# Patient Record
Sex: Male | Born: 1965 | Race: White | Hispanic: No | Marital: Married | Smoking: Never smoker
Health system: Southern US, Community
[De-identification: ages and names within clinical notes are randomized; demographics above are authoritative.]

---

## 2011-03-13 ENCOUNTER — Ambulatory Visit (HOSPITAL_COMMUNITY)
Admission: RE | Admit: 2011-03-13 | Discharge: 2011-03-13 | Disposition: A | Discharge status: Home / Self Care | Payer: 59 | Source: Ambulatory Visit | Attending: Internal Medicine | Admitting: Internal Medicine

## 2011-03-13 ENCOUNTER — Encounter (HOSPITAL_BASED_OUTPATIENT_CLINIC_OR_DEPARTMENT_OTHER): Payer: 59 | Admitting: Internal Medicine

## 2011-03-13 DIAGNOSIS — R1013 Epigastric pain: Secondary | ICD-10-CM

## 2011-03-13 DIAGNOSIS — K2960 Other gastritis without bleeding: Secondary | ICD-10-CM

## 2011-03-13 DIAGNOSIS — K259 Gastric ulcer, unspecified as acute or chronic, without hemorrhage or perforation: Secondary | ICD-10-CM | POA: Insufficient documentation

## 2011-03-31 NOTE — Op Note (Signed)
  NAMEJAYCE, Dominic Ayala              ACCOUNT NO.:  [id number]  MEDICAL RECORD NO.:  [id number]           PATIENT TYPE:  O  LOCATION:  DAYP                          FACILITY:  [facility]  PHYSICIAN:  Lionel December, M.D.    DATE OF BIRTH:  January 29, 1966  DATE OF PROCEDURE:  03/13/2011 DATE OF DISCHARGE:                              OPERATIVE REPORT   PROCEDURE:  Esophagogastroduodenoscopy.  INDICATION:  [name] is a 45 year old Caucasian male who has been experiencing intermittent epigastric pain for the last 4-5 months.  He stopped his aspirin and Aleve, is not 100% better.  He has also been treated for PPI and his H. pylori serology and further routine lab studies have been negative.  He is undergoing diagnostic EGD.  Procedure was reviewed with the patient.  Informed consent was obtained.  SURGEON:  Lionel December, MD  MEDICATIONS FOR CONSCIOUS SEDATION:  Cetacaine spray for pharyngeal topical anesthesia, Demerol 50 mg IV, and Versed 6 mg IV.  FINDINGS:  Procedure performed in Endoscopy Suite.  The patient's vital signs and O2 sat were monitored during the procedure and remained stable.  The patient was placed in left lateral position and Pentax videoscope was passed through oropharynx without any difficulty into esophagus.  Esophagus:  Mucosa of the esophagus normal.  GE junction was unremarkable, located at 40 cm from the incisors.  Stomach:  It was empty and distended very well with insufflation.  Folds of proximal stomach were normal.  Mucosa of the body and antrum was normal.  However, in the prepyloric region, there was patchy erythema. In the pyloric channel, there was a 3-mm superficial ulcer with surrounding erythema.  It looked like healing ulcer.  Pyloric channel was spastic, but not stenotic.  Scope was passed across multiple times. Angularis, fundus, and cardia were normal.  Duodenum:  Bulbar mucosa was normal.  Scope was passed through the second part of duodenum where  mucosa and folds were normal.  Ampulla of Vater was also well seen and appeared to be normal.  Endoscope was withdrawn.  The patient tolerated the procedure well.  FINAL DIAGNOSIS:  A 3- mm pyloric channel ulcer which appears to be healing along with associated inflammatory changes, but no evidence of pyloric stenosis.  Please note, the patient's H. pylori serology was negative.  RECOMMENDATIONS:  We will increase his pantoprazole to 40 mg twice daily, prescription given for 60 with 2 refills.  We will do stool antigen for H. pylori.  Further recommendations to follow.     Lionel December, M.D.     NR/MEDQ  D:  03/13/2011  T:  03/13/2011  Job:  [id number]  cc:   Lorin Picket A. Gerda Diss, MD Fax: [phone]  Electronically Signed by Lionel December M.D. on 03/31/2011 12:40:48 PM

## 2011-04-27 ENCOUNTER — Ambulatory Visit (INDEPENDENT_AMBULATORY_CARE_PROVIDER_SITE_OTHER): Payer: 59 | Admitting: Internal Medicine

## 2011-04-27 DIAGNOSIS — K257 Chronic gastric ulcer without hemorrhage or perforation: Secondary | ICD-10-CM

## 2013-11-20 ENCOUNTER — Encounter: Payer: Self-pay | Admitting: Family Medicine

## 2013-11-20 ENCOUNTER — Ambulatory Visit (INDEPENDENT_AMBULATORY_CARE_PROVIDER_SITE_OTHER): Payer: Managed Care, Other (non HMO) | Admitting: Family Medicine

## 2013-11-20 VITALS — BP 116/82 | Temp 98.7°F | Ht 68.0 in | Wt 190.2 lb

## 2013-11-20 DIAGNOSIS — R109 Unspecified abdominal pain: Secondary | ICD-10-CM

## 2013-11-20 DIAGNOSIS — Z125 Encounter for screening for malignant neoplasm of prostate: Secondary | ICD-10-CM

## 2013-11-20 DIAGNOSIS — Z0189 Encounter for other specified special examinations: Secondary | ICD-10-CM

## 2013-11-20 LAB — POCT URINALYSIS DIPSTICK: pH, UA: 5

## 2013-11-20 MED ORDER — MELOXICAM 15 MG PO TABS: 15.0000 mg | ORAL_TABLET | Freq: Every day | ORAL | Status: DC

## 2013-11-20 NOTE — Progress Notes (Addendum)
  Subjective:    Patient ID: Dominic Ayala, male    DOB: [birth year], 47 y.o.   MRN: [id number]  Abdominal Pain This is a new problem. The current episode started 1 to 4 weeks ago. The onset quality is sudden. The problem occurs intermittently. The problem has been unchanged. The pain is located in the RLQ. The pain is at a severity of 5/10. The pain is moderate. The quality of the pain is sharp. The abdominal pain does not radiate. Pertinent negatives include no diarrhea, hematuria, nausea or vomiting. Nothing aggravates the pain. The pain is relieved by nothing. He has tried nothing for the symptoms. The treatment provided no relief.   patient does state he drinks 6-12 beers on the weekend but none during the week he denies abuse denies been alcoholic. He denies any rectal bleeding hematuria.   Review of Systems  Constitutional: Negative for appetite change.  Gastrointestinal: Positive for abdominal pain. Negative for nausea, vomiting, diarrhea, blood in stool and rectal pain.       Right testicular pain  Genitourinary: Negative for hematuria and difficulty urinating.       Objective:   Physical Exam  Lungs are clear hearts regular pulse normal blood pressure good abdomen soft no guarding rebound or tenderness testicular exam normal no masses are felt no hernia. Prostate exam normal. Patient has been doing P 90 X work outs. He states that this problem started more so after he started these exercises    Assessment & Plan:  #1 right lower pelvic/groin discomfort-no abnormality felt on exam. We will check lab work. If negative then patient will try Mobic one daily for the next few weeks. If seemingly goes away with Mobic then will not need further testing if the discomfort does not go away then the ultrasound along with urology consultation.

## 2013-11-21 LAB — LIPID PANEL
Total CHOL/HDL Ratio: 2.4 Ratio
VLDL: 10 mg/dL (ref 0–40)

## 2013-11-21 LAB — CBC WITH DIFFERENTIAL/PLATELET
HCT: 44.5 % (ref 39.0–52.0)
Hemoglobin: 15.6 g/dL (ref 13.0–17.0)
Lymphocytes Relative: 18 % (ref 12–46)
Lymphs Abs: 0.9 10*3/uL (ref 0.7–4.0)
Monocytes Absolute: 0.5 10*3/uL (ref 0.1–1.0)
Monocytes Relative: 9 % (ref 3–12)
Neutro Abs: 3.5 10*3/uL (ref 1.7–7.7)
RBC: 4.97 MIL/uL (ref 4.22–5.81)
WBC: 4.9 10*3/uL (ref 4.0–10.5)

## 2013-11-21 LAB — HEPATIC FUNCTION PANEL
Albumin: 4.2 g/dL (ref 3.5–5.2)
Alkaline Phosphatase: 50 U/L (ref 39–117)
Total Bilirubin: 0.5 mg/dL (ref 0.3–1.2)

## 2013-11-21 LAB — PSA: PSA: 0.76 ng/mL (ref <=4.00)

## 2014-04-20 ENCOUNTER — Telehealth: Payer: Self-pay | Admitting: Family Medicine

## 2014-04-20 MED ORDER — SCOPOLAMINE 1 MG/3DAYS TD PT72: MEDICATED_PATCH | TRANSDERMAL | Status: DC

## 2014-04-20 NOTE — Telephone Encounter (Signed)
The patient should also note that if it causes significant drowsiness he may want to consider stopping it. In a small number of people can make vision a little fuzzy when he is trying to read the paper this is normal. Also in a small number of people it can make urination difficult if it makes urination too difficult then the best thing is to stop the medicine.

## 2014-04-20 NOTE — Telephone Encounter (Signed)
Pt going on a cruise, requesting Rx to be sent in for motion sickness patches, [facility] in [place], please call pt when done

## 2014-04-20 NOTE — Telephone Encounter (Signed)
Medication sent to pharmacy. Left message on voicemail notifying patient.  

## 2014-04-20 NOTE — Telephone Encounter (Signed)
Scopolamine patches. One box. 2 refills. Apply patch one day before leaving on a cruise. Change patch every 3 days.

## 2014-04-20 NOTE — Telephone Encounter (Signed)
Notified pharmacy to notify patient of cautions listed below.

## 2014-05-31 ENCOUNTER — Encounter: Payer: Self-pay | Admitting: Family Medicine

## 2014-05-31 ENCOUNTER — Ambulatory Visit (INDEPENDENT_AMBULATORY_CARE_PROVIDER_SITE_OTHER): Payer: Managed Care, Other (non HMO) | Admitting: Family Medicine

## 2014-05-31 VITALS — BP 128/92 | Ht 68.0 in | Wt 187.0 lb

## 2014-05-31 DIAGNOSIS — R51 Headache: Secondary | ICD-10-CM

## 2014-05-31 DIAGNOSIS — R519 Headache, unspecified: Secondary | ICD-10-CM

## 2014-05-31 DIAGNOSIS — Z569 Unspecified problems related to employment: Secondary | ICD-10-CM

## 2014-05-31 DIAGNOSIS — Z23 Encounter for immunization: Secondary | ICD-10-CM

## 2014-05-31 DIAGNOSIS — Z566 Other physical and mental strain related to work: Secondary | ICD-10-CM

## 2014-05-31 NOTE — Progress Notes (Signed)
   Subjective:    Patient ID: Dominic Ayala, male    DOB: 1966/07/15, 48 y.o.   MRN: [id number]  Headache  This is a new problem. Episode onset: A month ago. The problem occurs intermittently. The problem has been waxing and waning. The pain is located in the occipital region. The pain does not radiate. The pain quality is similar to prior headaches. The quality of the pain is described as dull. Pertinent negatives include no abdominal pain, coughing or weakness. The symptoms are aggravated by work. He has tried NSAIDs for the symptoms. The treatment provided mild relief.    Pt also has a spot on his right arm that he would like checked out. Its been there for a couple of months.     Review of Systems  Constitutional: Negative for activity change, appetite change and fatigue.  HENT: Negative for congestion.   Respiratory: Negative for cough.   Cardiovascular: Negative for chest pain.  Gastrointestinal: Negative for abdominal pain.  Endocrine: Negative for polydipsia and polyphagia.  Neurological: Positive for headaches. Negative for weakness.  Psychiatric/Behavioral: Negative for confusion.       Objective:   Physical Exam  Vitals reviewed. Constitutional: He appears well-nourished. No distress.  Cardiovascular: Normal rate, regular rhythm and normal heart sounds.   No murmur heard. Pulmonary/Chest: Effort normal and breath sounds normal. No respiratory distress.  Musculoskeletal: He exhibits no edema.  Lymphadenopathy:    He has no cervical adenopathy.  Neurological: He is alert.  Psychiatric: His behavior is normal.          Assessment & Plan:  Headaches-stress related blood pressure recheck is good healthy eating recommended he is searching for a new job which I think will help him. He denies alcohol use during the week he states anywhere from 2-6 beers on the weekend. I told him try to limit it to 6 or less on the weekend. Warning flags for severe headaches was discussed  if he has any of these such as double vision severe headache vomiting he needs to followup immediately patient states he understands this.  The area on his right arm is more of a skin age spot no sign of cancer at this point

## 2016-02-19 ENCOUNTER — Ambulatory Visit (INDEPENDENT_AMBULATORY_CARE_PROVIDER_SITE_OTHER): Payer: BLUE CROSS/BLUE SHIELD | Admitting: Family Medicine

## 2016-02-19 ENCOUNTER — Encounter: Payer: Self-pay | Admitting: Family Medicine

## 2016-02-19 VITALS — BP 120/78 | Ht 68.0 in | Wt 199.6 lb

## 2016-02-19 DIAGNOSIS — Z125 Encounter for screening for malignant neoplasm of prostate: Secondary | ICD-10-CM | POA: Diagnosis not present

## 2016-02-19 DIAGNOSIS — L989 Disorder of the skin and subcutaneous tissue, unspecified: Secondary | ICD-10-CM | POA: Diagnosis not present

## 2016-02-19 DIAGNOSIS — Z0000 Encounter for general adult medical examination without abnormal findings: Secondary | ICD-10-CM

## 2016-02-19 DIAGNOSIS — R1032 Left lower quadrant pain: Secondary | ICD-10-CM

## 2016-02-19 NOTE — Progress Notes (Signed)
   Subjective:    Patient ID: Dominic Ayala, male    DOB: 1966-04-18, 50 y.o.   MRN: [id number]  HPI  The patient comes in today for a wellness visit.    A review of their health history was completed.  A review of medications was also completed.  Any needed refills; no  Eating habits: good  Falls/  MVA accidents in past few months: none  Regular exercise: no  Specialist pt sees on regular basis: no  Preventative health issues were discussed.   no family history of colon cancer Additional concerns: hernia? Pain in left lower groin area  he relates intermittent discomfort in the left lower quadrant and some constipation as well denies any rectal bleeding fever chills or sweats  Review of Systems  Constitutional: Negative for fever, activity change and appetite change.  HENT: Negative for congestion and rhinorrhea.   Eyes: Negative for discharge.  Respiratory: Negative for cough and wheezing.   Cardiovascular: Negative for chest pain.  Gastrointestinal: Positive for abdominal pain ( left lower quadrant) and constipation ( Over the past month). Negative for vomiting and blood in stool.  Genitourinary: Negative for frequency and difficulty urinating.  Musculoskeletal: Negative for neck pain.  Skin: Negative for rash.  Allergic/Immunologic: Negative for environmental allergies and food allergies.  Neurological: Negative for weakness and headaches.  Psychiatric/Behavioral: Negative for agitation.       Objective:   Physical Exam  Constitutional: He appears well-developed and well-nourished.  HENT:  Head: Normocephalic and atraumatic.  Right Ear: External ear normal.  Left Ear: External ear normal.  Nose: Nose normal.  Mouth/Throat: Oropharynx is clear and moist.  Eyes: EOM are normal. Pupils are equal, round, and reactive to light.  Neck: Normal range of motion. Neck supple. No thyromegaly present.  Cardiovascular: Normal rate, regular rhythm and normal heart sounds.     No murmur heard. Pulmonary/Chest: Effort normal and breath sounds normal. No respiratory distress. He has no wheezes.  Abdominal: Soft. Bowel sounds are normal. He exhibits no distension and no mass. There is no tenderness.  Genitourinary: Rectum normal, prostate normal and penis normal.  Musculoskeletal: Normal range of motion. He exhibits no edema.  Lymphadenopathy:    He has no cervical adenopathy.  Neurological: He is alert. He exhibits normal muscle tone.  Skin: Skin is warm and dry. No erythema.  Psychiatric: He has a normal mood and affect. His behavior is normal. Judgment normal.    patient with abnormal mole on the right shoulder  /deltoidalso with  some tenderness subjective in the left lower quadrant but no masses were felt this does need further looking into      Assessment & Plan:   Patient was encouraged to try to minimize alcohol use Lab work recommended for screening purposes Lab work to look into the left lower quadrant discomfort Recommend colonoscopy #1 he is at the age where he needs this but #2 to help look into the left lower quadrant discomfort  if colonoscopy does not figure out why he is having left lower quadrant discomfort he may need CAT scan  patient also has abnormal mole on the right shoulder needs dermatology referral   Patient wants to make sure all referrals are within his network

## 2016-02-20 LAB — CBC WITH DIFFERENTIAL/PLATELET
Basophils Absolute: 0 10*3/uL (ref 0.0–0.2)
Basos: 1 %
EOS (ABSOLUTE): 0.1 10*3/uL (ref 0.0–0.4)
EOS: 2 %
HEMATOCRIT: 46.6 % (ref 37.5–51.0)
HEMOGLOBIN: 16.3 g/dL (ref 12.6–17.7)
IMMATURE GRANS (ABS): 0 10*3/uL (ref 0.0–0.1)
Immature Granulocytes: 0 %
LYMPHS ABS: 1.2 10*3/uL (ref 0.7–3.1)
LYMPHS: 23 %
MCH: 32 pg (ref 26.6–33.0)
MCHC: 35 g/dL (ref 31.5–35.7)
MCV: 91 fL (ref 79–97)
MONOCYTES: 10 %
Monocytes Absolute: 0.5 10*3/uL (ref 0.1–0.9)
Neutrophils Absolute: 3.3 10*3/uL (ref 1.4–7.0)
Neutrophils: 64 %
Platelets: 286 10*3/uL (ref 150–379)
RBC: 5.1 x10E6/uL (ref 4.14–5.80)
RDW: 12.8 % (ref 12.3–15.4)
WBC: 5.1 10*3/uL (ref 3.4–10.8)

## 2016-02-20 LAB — LIPID PANEL
CHOL/HDL RATIO: 3.1 ratio (ref 0.0–5.0)
Cholesterol, Total: 150 mg/dL (ref 100–199)
HDL: 48 mg/dL (ref >39)
LDL CALC: 88 mg/dL (ref 0–99)
TRIGLYCERIDES: 68 mg/dL (ref 0–149)
VLDL CHOLESTEROL CAL: 14 mg/dL (ref 5–40)

## 2016-02-20 LAB — BASIC METABOLIC PANEL
BUN/Creatinine Ratio: 15 (ref 9–20)
BUN: 13 mg/dL (ref 6–24)
CALCIUM: 9.8 mg/dL (ref 8.7–10.2)
CHLORIDE: 104 mmol/L (ref 96–106)
CO2: 28 mmol/L (ref 18–29)
Creatinine, Ser: 0.84 mg/dL (ref 0.76–1.27)
GFR calc Af Amer: 118 mL/min/{1.73_m2} (ref >59)
GFR calc non Af Amer: 102 mL/min/{1.73_m2} (ref >59)
GLUCOSE: 113 mg/dL — AB (ref 65–99)
POTASSIUM: 4.6 mmol/L (ref 3.5–5.2)
Sodium: 146 mmol/L — ABNORMAL HIGH (ref 134–144)

## 2016-02-20 LAB — HEPATIC FUNCTION PANEL
ALT: 53 IU/L — AB (ref 0–44)
AST: 34 IU/L (ref 0–40)
Albumin: 4.6 g/dL (ref 3.5–5.5)
Alkaline Phosphatase: 69 IU/L (ref 39–117)
BILIRUBIN TOTAL: 0.5 mg/dL (ref 0.0–1.2)
Bilirubin, Direct: 0.14 mg/dL (ref 0.00–0.40)
Total Protein: 7.1 g/dL (ref 6.0–8.5)

## 2016-02-20 LAB — PSA: PROSTATE SPECIFIC AG, SERUM: 0.6 ng/mL (ref 0.0–4.0)

## 2016-02-26 ENCOUNTER — Encounter: Payer: Self-pay | Admitting: Family Medicine

## 2016-02-28 ENCOUNTER — Other Ambulatory Visit: Payer: Self-pay | Admitting: *Deleted

## 2016-02-28 DIAGNOSIS — R748 Abnormal levels of other serum enzymes: Secondary | ICD-10-CM

## 2016-03-02 ENCOUNTER — Encounter: Payer: Self-pay | Admitting: Family Medicine

## 2016-03-29 ENCOUNTER — Telehealth: Payer: Self-pay | Admitting: Family Medicine

## 2016-03-29 NOTE — Telephone Encounter (Signed)
Please tell the patient that I did receive lab results from his liver profile collected on March 30. His liver enzymes are normal. This is good news. Patient was referred to gastroenterology Dr. Camille Bal in [facility]. I have not heard anything regarding his visit with them. Did the patient see the specialist? Did they do a colonoscopy? What were the results? Also if he is already seen specialists had all the testing done please send request to Dr. Wylie Hail office in [facility] requesting copies of the visit and colonoscopy thank you

## 2016-03-30 NOTE — Telephone Encounter (Signed)
Spoke with patient and informed him per Dr.Scott Luking-Dr.Scott  did receive lab results from his liver profile collected on March 30. His liver enzymes are normal. This is good news. Also asked patient if had seen Dr.Benson. Patient stated that his first appointment with Dr.Benson is on 04/08/16

## 2016-04-26 ENCOUNTER — Encounter: Payer: Self-pay | Admitting: Family Medicine

## 2016-04-26 NOTE — Telephone Encounter (Signed)
His colonoscopy went well. Letter sent to the patient.

## 2016-04-29 ENCOUNTER — Encounter: Payer: Self-pay | Admitting: Family Medicine

## 2016-04-29 DIAGNOSIS — D369 Benign neoplasm, unspecified site: Secondary | ICD-10-CM | POA: Insufficient documentation

## 2016-05-18 ENCOUNTER — Telehealth: Payer: Self-pay | Admitting: Family Medicine

## 2016-05-18 NOTE — Telephone Encounter (Signed)
Pt was sent to a specialist for a colonoscopy back three weeks ago and is wanting to know what the next steps for him will be. Please advise.

## 2016-05-18 NOTE — Telephone Encounter (Signed)
Nurse's-please talk with the patient. This nice gentleman was referred to the gastroenterologist in [facility] because of severe constipation as well as need for colonoscopy. His colonoscopy came back without any cancer. I assumed that the gastroenterologist also took care of his constipation. I did not receive any office visit note other than the colonoscopy. Please find out from the patient did gastroenterologist talk to him about constipation? Did the gastroenterologist recommend any medications or start any medicines? Also talk to the patient is at his main concern currently? 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 Please spent time talking with this nice gentleman explain the situation gather more information so I can help him appropriately thank you

## 2016-05-19 NOTE — Telephone Encounter (Signed)
Transferred patient to front desk to schedule appointment concerning this.

## 2016-05-28 ENCOUNTER — Ambulatory Visit (INDEPENDENT_AMBULATORY_CARE_PROVIDER_SITE_OTHER): Payer: BLUE CROSS/BLUE SHIELD | Admitting: Family Medicine

## 2016-05-28 ENCOUNTER — Encounter: Payer: Self-pay | Admitting: Family Medicine

## 2016-05-28 VITALS — BP 120/80 | Ht 68.0 in | Wt 198.4 lb

## 2016-05-28 DIAGNOSIS — R1032 Left lower quadrant pain: Secondary | ICD-10-CM | POA: Diagnosis not present

## 2016-05-28 NOTE — Progress Notes (Signed)
   Subjective:    Patient ID: Dominic Ayala, male    DOB: Mar 18, 1966, 50 y.o.   MRN: [id number]  HPI  Patient arrives for follow up on abdominal pain.Patient with frequent intermittent abdominal pain describes as severe in the mid abdomen the left mid quadrant in the left lower quadrant. States he has times where the pain is fairly intense other times where it's not quite as bad. Some nausea with no vomiting with it denies any rectal bleeding but does relate moderate constipation issues. Sometimes it does wake him up at night he denies fever chills or sweats with it. Patient states that he does not use alcohol frequently. He relates he is not taking any type of anti-inflammatories. Denies any pulled muscle or strains. Past medical family history noncontributory Review of Systems See above.    Objective:   Physical Exam  Lungs clear hearts regular pulse normal abdomen soft with mild-to-moderate midabdominal and left mid and left lower quadrant tenderness. Patient has had previous prostate exam and PSA which are normal.      Assessment & Plan:  Significant mid and lower abdominal pain and discomfort that has been going on for several months progressively worse we need to do a CAT scan rule out growth or tumor. Colonoscopy that he had did show adenomatous polyps but did not show any other particular problems gastroenterologist recommended doing a scan if ongoing abdominal pain

## 2016-05-29 ENCOUNTER — Telehealth: Payer: Self-pay | Admitting: *Deleted

## 2016-05-29 NOTE — Telephone Encounter (Signed)
Patient scheduled for CT abd/pelvis with and without contrast at [facility] 06/11/16 at 2:20pm be there at 1:50pm nothing to eat or drink 4 hrs before test

## 2016-05-29 NOTE — Telephone Encounter (Signed)
Left message to return call 

## 2016-06-01 NOTE — Telephone Encounter (Signed)
Left message to return call 

## 2016-06-01 NOTE — Telephone Encounter (Addendum)
Patient notified that he is scheduled for CT abd/pelvis with and without contrast at [facility] 06/11/16 at 2:20pm be there at 1:50pm nothing to eat or drink 4 hrs before test. Patient verbalized understanding.

## 2016-06-10 ENCOUNTER — Telehealth: Payer: Self-pay | Admitting: *Deleted

## 2016-06-10 NOTE — Telephone Encounter (Signed)
[facility] called wanting order changed. Pt scheduled for tomorrow for ct abd pelvis with and without contrast. Radiologist states it needs to be ct abd/pelvis with contrast. Consult with dr Richardson Landry. Ok to change. New order faxed to [facility].

## 2016-06-21 ENCOUNTER — Telehealth: Payer: Self-pay | Admitting: Family Medicine

## 2016-06-21 NOTE — Telephone Encounter (Signed)
Please let the patient know that his CT scan that he had at [facility] on June 15 did not show any tumors or growths. It is considered normal. Very important for the patient to make sure he stays on a good diet to keep his bowel movements soft. Also it is possible that his abdominal discomfort he is having is musculoskeletal. If it is not getting better over the course of the next month I can help set him up with physical therapy. Follow-up in the 2 months time for recheck

## 2016-06-22 NOTE — Telephone Encounter (Signed)
Spoke with patient and informed her per Dr.Scott Luking- CT scan that he had at [facility] on June 15 did not show any tumors or growths. It is considered normal. Very important for the patient to make sure he stays on a good diet to keep his bowel movements soft. Also it is possible that his abdominal discomfort he is having is musculoskeletal. If it is not getting better over the course of the next month I can help set him up with physical therapy. Follow-up in the 2 months time for recheck. Patient verbalized understanding.

## 2016-07-29 ENCOUNTER — Encounter: Payer: Self-pay | Admitting: Family Medicine

## 2016-10-02 ENCOUNTER — Telehealth: Payer: Self-pay | Admitting: *Deleted

## 2016-10-02 NOTE — Telephone Encounter (Signed)
[facility]  - See form in nurse's basket. Pt needs to come and sign or if he choses not to sign just fax with out signature per dr Nicki Reaper. Mail copy to pt and also scan into chart. Send lab results with form also. They are also in folder.

## 2016-10-15 NOTE — Telephone Encounter (Signed)
Formed faxed and mailed to address on form. Form left up front to be mailed to pt and scanned into pt's chart.

## 2017-04-02 ENCOUNTER — Encounter: Payer: Self-pay | Admitting: Family Medicine

## 2017-04-02 NOTE — Telephone Encounter (Signed)
ERROR

## 2017-04-12 ENCOUNTER — Ambulatory Visit (INDEPENDENT_AMBULATORY_CARE_PROVIDER_SITE_OTHER): Payer: BLUE CROSS/BLUE SHIELD | Admitting: Nurse Practitioner

## 2017-04-12 ENCOUNTER — Encounter: Payer: Self-pay | Admitting: Nurse Practitioner

## 2017-04-12 VITALS — BP 124/82 | Temp 98.3°F | Ht 68.0 in | Wt 199.0 lb

## 2017-04-12 DIAGNOSIS — M5442 Lumbago with sciatica, left side: Secondary | ICD-10-CM

## 2017-04-12 MED ORDER — PREDNISONE 20 MG PO TABS: ORAL_TABLET | ORAL | 0 refills | Status: DC

## 2017-04-13 ENCOUNTER — Ambulatory Visit: Payer: BLUE CROSS/BLUE SHIELD | Admitting: Family Medicine

## 2017-04-14 ENCOUNTER — Encounter: Payer: Self-pay | Admitting: Nurse Practitioner

## 2017-04-14 NOTE — Progress Notes (Signed)
Subjective:  Presents for c/o left low back pain for about 4-5 weeks. Has been going to chiropractor off/on for years. No specific history of injury. Began after working around his property. Worse at times. Continues stretching and chiropractic care. No change in bowel or bladder habits. Pain goes from left low back area to the mid buttock down the leg to the calf area. No numbness or weakness of left foot.   Objective:   BP 124/82   Temp 98.3 F (36.8 C)   Ht 5\' 8"  (1.727 m)   Wt 199 lb (90.3 kg)   BMI 30.26 kg/m  NAD. Alert, oriented. Minimal tenderness with palpation of left low back area. SLR neg right; positive left. Reflexes normal lower extremities. Can perform ROM of the back with some tenderness with lateral movement and rotation. Can perform heel and toe walking.   Assessment:  Acute left-sided low back pain with left-sided sciatica    Plan:   Meds ordered this encounter  Medications  . predniSONE (DELTASONE) 20 MG tablet    Sig: 3 po qd x 3 d then 2 po qd x 3 d then 1 po qd x 2 d    Dispense:  17 tablet    Refill:  0    Order Specific Question:   Supervising Provider    Answer:   LUKING, WILLIAM S [[id number]]   Ice/heat applications. Continue stretching. Lidocaine patches or Biofreeze. Call back in 7-10 days if not resolved, sooner if worse.

## 2018-11-16 ENCOUNTER — Telehealth: Payer: Self-pay | Admitting: Family Medicine

## 2018-11-16 DIAGNOSIS — Z125 Encounter for screening for malignant neoplasm of prostate: Secondary | ICD-10-CM

## 2018-11-16 DIAGNOSIS — Z13220 Encounter for screening for lipoid disorders: Secondary | ICD-10-CM

## 2018-11-16 DIAGNOSIS — Z79899 Other long term (current) drug therapy: Secondary | ICD-10-CM

## 2018-11-16 NOTE — Telephone Encounter (Signed)
Labs placed in epic. I called and left a message to r/c.

## 2018-11-16 NOTE — Telephone Encounter (Signed)
Patient is aware 

## 2018-11-16 NOTE — Telephone Encounter (Signed)
Pt would like to have lab work done before his physical on 12/6.

## 2018-11-16 NOTE — Telephone Encounter (Signed)
All the lab work that was completed on 2017 as listed should be repeated again

## 2018-11-16 NOTE — Telephone Encounter (Signed)
Last had labs 02/19/2016 Psa,bmet,hepatic,lip,cbc. Please advise.

## 2018-12-01 LAB — CBC WITH DIFFERENTIAL/PLATELET
Basophils Absolute: 0 10*3/uL (ref 0.0–0.2)
Basos: 1 %
EOS (ABSOLUTE): 0.1 10*3/uL (ref 0.0–0.4)
Eos: 2 %
Hematocrit: 48.9 % (ref 37.5–51.0)
Hemoglobin: 17.1 g/dL (ref 13.0–17.7)
IMMATURE GRANS (ABS): 0 10*3/uL (ref 0.0–0.1)
Immature Granulocytes: 0 %
LYMPHS: 14 %
Lymphocytes Absolute: 0.9 10*3/uL (ref 0.7–3.1)
MCH: 31.9 pg (ref 26.6–33.0)
MCHC: 35 g/dL (ref 31.5–35.7)
MCV: 91 fL (ref 79–97)
MONOCYTES: 15 %
Monocytes Absolute: 0.9 10*3/uL (ref 0.1–0.9)
Neutrophils Absolute: 4.2 10*3/uL (ref 1.4–7.0)
Neutrophils: 68 %
PLATELETS: 230 10*3/uL (ref 150–450)
RBC: 5.36 x10E6/uL (ref 4.14–5.80)
RDW: 12.4 % (ref 12.3–15.4)
WBC: 6.1 10*3/uL (ref 3.4–10.8)

## 2018-12-01 LAB — BASIC METABOLIC PANEL
BUN/Creatinine Ratio: 14 (ref 9–20)
BUN: 13 mg/dL (ref 6–24)
CALCIUM: 9.5 mg/dL (ref 8.7–10.2)
CO2: 23 mmol/L (ref 20–29)
CREATININE: 0.91 mg/dL (ref 0.76–1.27)
Chloride: 99 mmol/L (ref 96–106)
GFR calc Af Amer: 111 mL/min/{1.73_m2} (ref >59)
GFR, EST NON AFRICAN AMERICAN: 96 mL/min/{1.73_m2} (ref >59)
Glucose: 112 mg/dL — ABNORMAL HIGH (ref 65–99)
Potassium: 4.7 mmol/L (ref 3.5–5.2)
Sodium: 141 mmol/L (ref 134–144)

## 2018-12-01 LAB — HEPATIC FUNCTION PANEL
ALK PHOS: 73 IU/L (ref 39–117)
ALT: 135 IU/L — ABNORMAL HIGH (ref 0–44)
AST: 72 IU/L — AB (ref 0–40)
Albumin: 4.5 g/dL (ref 3.5–5.5)
BILIRUBIN TOTAL: 0.7 mg/dL (ref 0.0–1.2)
BILIRUBIN, DIRECT: 0.18 mg/dL (ref 0.00–0.40)
Total Protein: 7.2 g/dL (ref 6.0–8.5)

## 2018-12-01 LAB — LIPID PANEL
CHOL/HDL RATIO: 3.3 ratio (ref 0.0–5.0)
Cholesterol, Total: 186 mg/dL (ref 100–199)
HDL: 57 mg/dL (ref >39)
LDL Calculated: 115 mg/dL — ABNORMAL HIGH (ref 0–99)
Triglycerides: 71 mg/dL (ref 0–149)
VLDL Cholesterol Cal: 14 mg/dL (ref 5–40)

## 2018-12-01 LAB — PSA: PROSTATE SPECIFIC AG, SERUM: 0.6 ng/mL (ref 0.0–4.0)

## 2018-12-02 ENCOUNTER — Encounter: Payer: Self-pay | Admitting: Family Medicine

## 2018-12-02 ENCOUNTER — Ambulatory Visit (INDEPENDENT_AMBULATORY_CARE_PROVIDER_SITE_OTHER): Payer: BLUE CROSS/BLUE SHIELD | Admitting: Family Medicine

## 2018-12-02 VITALS — BP 134/88 | Temp 99.1°F | Ht 68.5 in | Wt 202.6 lb

## 2018-12-02 DIAGNOSIS — R748 Abnormal levels of other serum enzymes: Secondary | ICD-10-CM

## 2018-12-02 DIAGNOSIS — Z0000 Encounter for general adult medical examination without abnormal findings: Secondary | ICD-10-CM | POA: Diagnosis not present

## 2018-12-02 DIAGNOSIS — R1032 Left lower quadrant pain: Secondary | ICD-10-CM | POA: Diagnosis not present

## 2018-12-02 NOTE — Progress Notes (Signed)
[place] order placed and Referral placed in Epic for an appointment with Dr.Rehman.

## 2018-12-02 NOTE — Progress Notes (Signed)
Tried calling no answer. I called and left a vm,asked that he r/c.

## 2018-12-02 NOTE — Progress Notes (Signed)
   Subjective:    Patient ID: Dominic Ayala, male    DOB: 1966/09/14, 52 y.o.   MRN: [id number]  HPI The patient comes in today for a wellness visit.   Very nice patient In a high stress job Does not always get good sleep at night because typically gets called a lot from his workplace A review of their health history was completed.  A review of medications was also completed.  Any needed refills; not taking any meds  Eating habits: health conscious  Falls/  MVA accidents in past few months: none  Regular exercise: no exercise, activities around house/yard  Specialist pt sees on regular basis: none  Preventative health issues were discussed.   Additional concerns:lower abdominal pain for past couple of months. Getting worse.   Patient does consume more alcohol than he should he states on the weekend he may drink anywhere from 6-12 beers during the week he denies drinking  Review of Systems  Constitutional: Negative for diaphoresis and fatigue.  HENT: Negative for congestion and rhinorrhea.   Respiratory: Negative for cough and shortness of breath.   Cardiovascular: Negative for chest pain and leg swelling.  Gastrointestinal: Negative for abdominal pain and diarrhea.  Skin: Negative for color change and rash.  Neurological: Negative for dizziness and headaches.  Psychiatric/Behavioral: Negative for behavioral problems and confusion.       Objective:   Physical Exam  Constitutional: He appears well-nourished. No distress.  HENT:  Head: Normocephalic and atraumatic.  Eyes: Right eye exhibits no discharge. Left eye exhibits no discharge.  Neck: No tracheal deviation present.  Cardiovascular: Normal rate, regular rhythm and normal heart sounds.  No murmur heard. Pulmonary/Chest: Effort normal and breath sounds normal. No respiratory distress.  Musculoskeletal: He exhibits no edema.  Lymphadenopathy:    He has no cervical adenopathy.  Neurological: He is alert.  Coordination normal.  Skin: Skin is warm and dry.  Psychiatric: He has a normal mood and affect. His behavior is normal.  Vitals reviewed. 20-minute discussion held with patient regarding his elevated liver enzymes in the work-up of this  Prostate exam normal      Assessment & Plan:  Adult wellness-complete.wellness physical was conducted today. Importance of diet and exercise were discussed in detail.  In addition to this a discussion regarding safety was also covered. We also reviewed over immunizations and gave recommendations regarding current immunization needed for age.  In addition to this additional areas were also touched on including: Preventative health exams needed:  Colonoscopy due 2022  Patient was advised yearly wellness exam  Patient would benefit from work-up of probable fatty liver I encouraged him to cut out alcohol or at the very least cut it way back to less than 1 or 2 beers a day We will try to add additional lab work to what he is currently did the other day He will need to have ultrasound of the liver  He also has some intermittent lower left quadrant abdominal discomfort referral to gastroenterology for this may need a early colonoscopy I do not feel there is an indication for CAT scan right at the moment but is possible this may need to be done

## 2018-12-02 NOTE — Addendum Note (Signed)
Addended by: Karle Barr on: 12/02/2018 02:52 PM   Modules accepted: Orders

## 2018-12-02 NOTE — Patient Instructions (Signed)
Results for orders placed or performed in visit on 11/16/18  PSA  Result Value Ref Range   Prostate Specific Ag, Serum 0.6 0.0 - 4.0 ng/mL  Basic metabolic panel  Result Value Ref Range   Glucose 112 (H) 65 - 99 mg/dL   BUN 13 6 - 24 mg/dL   Creatinine, Ser 0.91 0.76 - 1.27 mg/dL   GFR calc non Af Amer 96 >59 mL/min/1.73   GFR calc Af Amer 111 >59 mL/min/1.73   BUN/Creatinine Ratio 14 9 - 20   Sodium 141 134 - 144 mmol/L   Potassium 4.7 3.5 - 5.2 mmol/L   Chloride 99 96 - 106 mmol/L   CO2 23 20 - 29 mmol/L   Calcium 9.5 8.7 - 10.2 mg/dL  Hepatic function panel  Result Value Ref Range   Total Protein 7.2 6.0 - 8.5 g/dL   Albumin 4.5 3.5 - 5.5 g/dL   Bilirubin Total 0.7 0.0 - 1.2 mg/dL   Bilirubin, Direct 0.18 0.00 - 0.40 mg/dL   Alkaline Phosphatase 73 39 - 117 IU/L   AST 72 (H) 0 - 40 IU/L   ALT 135 (H) 0 - 44 IU/L  Lipid panel  Result Value Ref Range   Cholesterol, Total 186 100 - 199 mg/dL   Triglycerides 71 0 - 149 mg/dL   HDL 57 >39 mg/dL   VLDL Cholesterol Cal 14 5 - 40 mg/dL   LDL Calculated 115 (H) 0 - 99 mg/dL   Chol/HDL Ratio 3.3 0.0 - 5.0 ratio  CBC with Differential/Platelet  Result Value Ref Range   WBC 6.1 3.4 - 10.8 x10E3/uL   RBC 5.36 4.14 - 5.80 x10E6/uL   Hemoglobin 17.1 13.0 - 17.7 g/dL   Hematocrit 48.9 37.5 - 51.0 %   MCV 91 79 - 97 fL   MCH 31.9 26.6 - 33.0 pg   MCHC 35.0 31.5 - 35.7 g/dL   RDW 12.4 12.3 - 15.4 %   Platelets 230 150 - 450 x10E3/uL   Neutrophils 68 Not Estab. %   Lymphs 14 Not Estab. %   Monocytes 15 Not Estab. %   Eos 2 Not Estab. %   Basos 1 Not Estab. %   Neutrophils Absolute 4.2 1.4 - 7.0 x10E3/uL   Lymphocytes Absolute 0.9 0.7 - 3.1 x10E3/uL   Monocytes Absolute 0.9 0.1 - 0.9 x10E3/uL   EOS (ABSOLUTE) 0.1 0.0 - 0.4 x10E3/uL   Basophils Absolute 0.0 0.0 - 0.2 x10E3/uL   Immature Granulocytes 0 Not Estab. %   Immature Grans (Abs) 0.0 0.0 - 0.1 x10E3/uL

## 2018-12-02 NOTE — Progress Notes (Signed)
Test added to Labs drawn on 11/30/2018 at [place] by [facility].12/02/2018 at 2:40pm

## 2018-12-03 LAB — SPECIMEN STATUS REPORT

## 2018-12-05 ENCOUNTER — Encounter: Payer: Self-pay | Admitting: Family Medicine

## 2018-12-07 ENCOUNTER — Encounter: Payer: Self-pay | Admitting: Family Medicine

## 2018-12-08 ENCOUNTER — Ambulatory Visit (HOSPITAL_COMMUNITY): Payer: Self-pay

## 2018-12-08 LAB — IRON AND TIBC
IRON SATURATION: 30 % (ref 15–55)
Iron: 102 ug/dL (ref 38–169)
TIBC: 340 ug/dL (ref 250–450)
UIBC: 238 ug/dL (ref 111–343)

## 2018-12-08 LAB — FERRITIN: Ferritin: 523 ng/mL — ABNORMAL HIGH (ref 30–400)

## 2018-12-08 LAB — SPECIMEN STATUS REPORT

## 2018-12-08 LAB — CERULOPLASMIN: Ceruloplasmin: 23.7 mg/dL (ref 16.0–31.0)

## 2018-12-08 LAB — HEPATITIS B SURFACE ANTIGEN: Hepatitis B Surface Ag: NEGATIVE

## 2018-12-08 LAB — ANA: Anti Nuclear Antibody(ANA): NEGATIVE

## 2018-12-08 LAB — HEPATITIS C ANTIBODY: Hep C Virus Ab: <0.1 s/co ratio (ref 0.0–0.9)

## 2018-12-13 ENCOUNTER — Ambulatory Visit (HOSPITAL_COMMUNITY)
Admission: RE | Admit: 2018-12-13 | Discharge: 2018-12-13 | Disposition: A | Discharge status: Home / Self Care | Payer: BLUE CROSS/BLUE SHIELD | Source: Ambulatory Visit | Attending: Family Medicine | Admitting: Family Medicine

## 2018-12-13 DIAGNOSIS — R1032 Left lower quadrant pain: Secondary | ICD-10-CM | POA: Diagnosis not present

## 2018-12-13 DIAGNOSIS — R748 Abnormal levels of other serum enzymes: Secondary | ICD-10-CM | POA: Insufficient documentation

## 2018-12-13 DIAGNOSIS — K760 Fatty (change of) liver, not elsewhere classified: Secondary | ICD-10-CM | POA: Diagnosis not present

## 2018-12-26 ENCOUNTER — Ambulatory Visit (INDEPENDENT_AMBULATORY_CARE_PROVIDER_SITE_OTHER): Payer: BLUE CROSS/BLUE SHIELD | Admitting: Internal Medicine

## 2018-12-26 ENCOUNTER — Encounter (INDEPENDENT_AMBULATORY_CARE_PROVIDER_SITE_OTHER): Payer: Self-pay | Admitting: Internal Medicine

## 2018-12-26 VITALS — BP 130/80 | HR 64 | Temp 98.0°F | Ht 68.0 in | Wt 205.6 lb

## 2018-12-26 DIAGNOSIS — R1032 Left lower quadrant pain: Secondary | ICD-10-CM | POA: Diagnosis not present

## 2018-12-26 DIAGNOSIS — R748 Abnormal levels of other serum enzymes: Secondary | ICD-10-CM | POA: Diagnosis not present

## 2018-12-26 NOTE — Patient Instructions (Signed)
Labs and CT abdomen.

## 2018-12-26 NOTE — Progress Notes (Addendum)
   Subjective:    Patient ID: Dominic Ayala, male    DOB: [birth year], 52 y.o.   MRN: [id number]  HPI Referred by Dr. Wolfgang Phoenix for elevated liver enzymes and LLQ pain. [place] RUQ on 12/13/2018 revealed IMPRESSION: Diffuse hepatic steatosis. Normal gallbladder and biliary tree. Last colonoscopy in 2017 per patient Dr. Britta Mccreedy. ( I will locate that report). Has pain in his LLQ for several months. The pain comes and goes. But lately, it has become more constant.  Describes as a warm, tingling feeling.  His appetite is good for the most part.  He says he has irregular BMs.  He had a BM x 2 a day. Now he leans toward constipation.  No family hx of colon cancer. Denies any fever. Underwent a CT abdomen/pelvis with CM  06/11/2016 for LLQ pain which was normal.   04/20/2016 Colonoscopy (screeing).  Sessile polyp raging between 5-9 mm found at hepatic flexure and cecum. Polypectomy.  Biopsy Serrated adenoma.   Drinks 12-14 beers in a week.   11/30/2018 Hep C virus negative. ANA negative.  Iron 102, UIBC 238, TIBC 340.   Ferritin 523, Hepatitis B surface Ag negative, ceruloplasmin 23.7  Iron Hepatic Function Latest Ref Rng & Units 11/30/2018 02/19/2016 11/20/2013  Total Protein 6.0 - 8.5 g/dL 7.2 7.1 6.4  Albumin 3.5 - 5.5 g/dL 4.5 4.6 4.2  AST 0 - 40 IU/L 72(H) 34 18  ALT 0 - 44 IU/L 135(H) 53(H) 20  Alk Phosphatase 39 - 117 IU/L 73 69 50  Total Bilirubin 0.0 - 1.2 mg/dL 0.7 0.5 0.5  Bilirubin, Direct 0.00 - 0.40 mg/dL 0.18 0.14 0.1   CBC    Component Value Date/Time   WBC 6.1 11/30/2018 0844   WBC 4.9 11/20/2013 0749   RBC 5.36 11/30/2018 0844   RBC 4.97 11/20/2013 0749   HGB 17.1 11/30/2018 0844   HCT 48.9 11/30/2018 0844   PLT 230 11/30/2018 0844   MCV 91 11/30/2018 0844   MCH 31.9 11/30/2018 0844   MCH 31.4 11/20/2013 0749   MCHC 35.0 11/30/2018 0844   MCHC 35.1 11/20/2013 0749   RDW 12.4 11/30/2018 0844   LYMPHSABS 0.9 11/30/2018 0844   MONOABS 0.5 11/20/2013 0749   EOSABS 0.1  11/30/2018 0844   BASOSABS 0.0 11/30/2018 0844     Review of Systems History reviewed. No pertinent past medical history.  History reviewed. No pertinent surgical history.  No Known Allergies  No current outpatient medications on file prior to visit.   No current facility-administered medications on file prior to visit.         Objective:   Physical Exam Blood pressure 130/80, pulse 64, temperature 98 F (36.7 C), height '5\' 8"'$  (1.727 m), weight 205 lb 9.6 oz (93.3 kg). Alert and oriented. Skin warm and dry. Oral mucosa is moist.   . Sclera anicteric, conjunctivae is pink. Thyroid not enlarged. No cervical lymphadenopathy. Lungs clear. Heart regular rate and rhythm.  Abdomen is soft. Bowel sounds are positive. No hepatomegaly. No abdominal masses felt. No tenderness.  No edema to lower extremities.           Assessment & Plan:  Elevated liver enzymes. Will repeat Hepatic function LLQ pain: CT abdomen/pelvic CM.  Will get last colonoscopy report. Further recommendations to follow (done)

## 2018-12-27 LAB — HEPATIC FUNCTION PANEL
AG Ratio: 1.6 (calc) (ref 1.0–2.5)
ALKALINE PHOSPHATASE (APISO): 70 U/L (ref 40–115)
ALT: 72 U/L — AB (ref 9–46)
AST: 34 U/L (ref 10–35)
Albumin: 4.4 g/dL (ref 3.6–5.1)
BILIRUBIN INDIRECT: 0.5 mg/dL (ref 0.2–1.2)
Bilirubin, Direct: 0.1 mg/dL (ref 0.0–0.2)
Globulin: 2.7 g/dL (calc) (ref 1.9–3.7)
TOTAL PROTEIN: 7.1 g/dL (ref 6.1–8.1)
Total Bilirubin: 0.6 mg/dL (ref 0.2–1.2)

## 2018-12-27 LAB — HEPATITIS PANEL, ACUTE
HEP B C IGM: NONREACTIVE
HEP B S AG: NONREACTIVE
HEP C AB: NONREACTIVE
Hep A IgM: NONREACTIVE
SIGNAL TO CUT-OFF: 0.04 (ref <1.00)

## 2019-01-10 ENCOUNTER — Ambulatory Visit (HOSPITAL_COMMUNITY)
Admission: RE | Admit: 2019-01-10 | Discharge: 2019-01-10 | Disposition: A | Discharge status: Home / Self Care | Payer: BLUE CROSS/BLUE SHIELD | Source: Ambulatory Visit | Attending: Internal Medicine | Admitting: Internal Medicine

## 2019-01-10 DIAGNOSIS — R1032 Left lower quadrant pain: Secondary | ICD-10-CM | POA: Diagnosis present

## 2019-01-10 MED ORDER — IOHEXOL 300 MG/ML  SOLN: 100.0000 mL | Freq: Once | INTRAMUSCULAR | Status: DC | PRN

## 2019-01-10 MED ORDER — IOPAMIDOL (ISOVUE-300) INJECTION 61%
100.0000 mL | Freq: Once | INTRAVENOUS | Status: AC | PRN
  Administered 2019-01-10: 100 mL via INTRAVENOUS

## 2019-01-11 ENCOUNTER — Telehealth (INDEPENDENT_AMBULATORY_CARE_PROVIDER_SITE_OTHER): Payer: Self-pay | Admitting: Internal Medicine

## 2019-01-11 ENCOUNTER — Other Ambulatory Visit (INDEPENDENT_AMBULATORY_CARE_PROVIDER_SITE_OTHER): Payer: Self-pay | Admitting: *Deleted

## 2019-01-11 DIAGNOSIS — K5732 Diverticulitis of large intestine without perforation or abscess without bleeding: Secondary | ICD-10-CM

## 2019-01-11 DIAGNOSIS — R748 Abnormal levels of other serum enzymes: Secondary | ICD-10-CM

## 2019-01-11 MED ORDER — METRONIDAZOLE 500 MG PO TABS: 500.0000 mg | ORAL_TABLET | Freq: Two times a day (BID) | ORAL | 0 refills | Status: DC

## 2019-01-11 MED ORDER — CIPROFLOXACIN HCL 500 MG PO TABS: 500.0000 mg | ORAL_TABLET | Freq: Two times a day (BID) | ORAL | 0 refills | Status: DC

## 2019-01-11 NOTE — Telephone Encounter (Signed)
I have sent an Rx over to his pharmacy. Cipro and Flagyl x 14 days.

## 2019-01-11 NOTE — Telephone Encounter (Signed)
Hepatic in 4 weeks. Please send letter.

## 2019-01-11 NOTE — Telephone Encounter (Signed)
Hepatic Profile Noted for 4 weeks and a letter will be sent to the patient as a reminder.

## 2019-01-23 ENCOUNTER — Encounter (INDEPENDENT_AMBULATORY_CARE_PROVIDER_SITE_OTHER): Payer: Self-pay | Admitting: *Deleted

## 2019-01-23 ENCOUNTER — Other Ambulatory Visit (INDEPENDENT_AMBULATORY_CARE_PROVIDER_SITE_OTHER): Payer: Self-pay | Admitting: *Deleted

## 2019-01-23 DIAGNOSIS — R748 Abnormal levels of other serum enzymes: Secondary | ICD-10-CM

## 2019-02-02 LAB — HEPATIC FUNCTION PANEL
AG Ratio: 1.6 (calc) (ref 1.0–2.5)
ALT: 68 U/L — ABNORMAL HIGH (ref 9–46)
AST: 34 U/L (ref 10–35)
Albumin: 4.1 g/dL (ref 3.6–5.1)
Alkaline phosphatase (APISO): 60 U/L (ref 35–144)
BILIRUBIN DIRECT: 0.1 mg/dL (ref 0.0–0.2)
Globulin: 2.5 g/dL (calc) (ref 1.9–3.7)
Indirect Bilirubin: 0.5 mg/dL (calc) (ref 0.2–1.2)
Total Bilirubin: 0.6 mg/dL (ref 0.2–1.2)
Total Protein: 6.6 g/dL (ref 6.1–8.1)

## 2019-02-03 ENCOUNTER — Telehealth (INDEPENDENT_AMBULATORY_CARE_PROVIDER_SITE_OTHER): Payer: Self-pay | Admitting: Internal Medicine

## 2019-02-03 ENCOUNTER — Other Ambulatory Visit (INDEPENDENT_AMBULATORY_CARE_PROVIDER_SITE_OTHER): Payer: Self-pay | Admitting: *Deleted

## 2019-02-03 ENCOUNTER — Other Ambulatory Visit (INDEPENDENT_AMBULATORY_CARE_PROVIDER_SITE_OTHER): Payer: Self-pay | Admitting: Internal Medicine

## 2019-02-03 DIAGNOSIS — R748 Abnormal levels of other serum enzymes: Secondary | ICD-10-CM

## 2019-02-03 DIAGNOSIS — R933 Abnormal findings on diagnostic imaging of other parts of digestive tract: Secondary | ICD-10-CM

## 2019-02-03 NOTE — Telephone Encounter (Signed)
Dominic Ayala, Colonoscopy. 6 weeks out. Hx of diverticulitis.

## 2019-02-06 ENCOUNTER — Encounter (INDEPENDENT_AMBULATORY_CARE_PROVIDER_SITE_OTHER): Payer: Self-pay | Admitting: *Deleted

## 2019-02-06 ENCOUNTER — Telehealth (INDEPENDENT_AMBULATORY_CARE_PROVIDER_SITE_OTHER): Payer: Self-pay | Admitting: *Deleted

## 2019-02-06 DIAGNOSIS — R933 Abnormal findings on diagnostic imaging of other parts of digestive tract: Secondary | ICD-10-CM | POA: Insufficient documentation

## 2019-02-06 MED ORDER — SUPREP BOWEL PREP KIT 17.5-3.13-1.6 GM/177ML PO SOLN: 1.0000 | Freq: Once | ORAL | 0 refills | Status: AC

## 2019-02-06 NOTE — Telephone Encounter (Signed)
Patient needs suprep 

## 2019-02-06 NOTE — Telephone Encounter (Signed)
TCS sch'd 03/16/19 at 1200 (1100), left detailed message for patient, instructions mialed

## 2019-03-16 ENCOUNTER — Other Ambulatory Visit: Payer: Self-pay

## 2019-03-16 ENCOUNTER — Other Ambulatory Visit (INDEPENDENT_AMBULATORY_CARE_PROVIDER_SITE_OTHER): Payer: Self-pay | Admitting: *Deleted

## 2019-03-16 ENCOUNTER — Ambulatory Visit (HOSPITAL_COMMUNITY)
Admission: RE | Admit: 2019-03-16 | Discharge: 2019-03-16 | Disposition: A | Discharge status: Home / Self Care | Payer: BLUE CROSS/BLUE SHIELD | Attending: Internal Medicine | Admitting: Internal Medicine

## 2019-03-16 ENCOUNTER — Encounter (HOSPITAL_COMMUNITY)
Admission: RE | Disposition: A | Discharge status: Home / Self Care | Payer: Self-pay | Source: Home / Self Care | Attending: Internal Medicine

## 2019-03-16 ENCOUNTER — Encounter (INDEPENDENT_AMBULATORY_CARE_PROVIDER_SITE_OTHER): Payer: Self-pay | Admitting: *Deleted

## 2019-03-16 ENCOUNTER — Encounter (HOSPITAL_COMMUNITY): Payer: Self-pay | Admitting: *Deleted

## 2019-03-16 DIAGNOSIS — K648 Other hemorrhoids: Secondary | ICD-10-CM

## 2019-03-16 DIAGNOSIS — K760 Fatty (change of) liver, not elsewhere classified: Secondary | ICD-10-CM | POA: Insufficient documentation

## 2019-03-16 DIAGNOSIS — R933 Abnormal findings on diagnostic imaging of other parts of digestive tract: Secondary | ICD-10-CM | POA: Insufficient documentation

## 2019-03-16 DIAGNOSIS — Z86010 Personal history of colonic polyps: Secondary | ICD-10-CM | POA: Insufficient documentation

## 2019-03-16 DIAGNOSIS — R748 Abnormal levels of other serum enzymes: Secondary | ICD-10-CM

## 2019-03-16 HISTORY — PX: COLONOSCOPY: SHX5424

## 2019-03-16 SURGERY — COLONOSCOPY
Anesthesia: Moderate Sedation

## 2019-03-16 MED ORDER — SODIUM CHLORIDE 0.9 % IV SOLN
INTRAVENOUS | Status: DC
  Administered 2019-03-16: 12:00:00 via INTRAVENOUS

## 2019-03-16 MED ORDER — STERILE WATER FOR IRRIGATION IR SOLN
Status: DC | PRN
  Administered 2019-03-16: 12:00:00

## 2019-03-16 MED ORDER — MEPERIDINE HCL 50 MG/ML IJ SOLN
INTRAMUSCULAR | Status: AC
  Filled 2019-03-16: qty 1

## 2019-03-16 MED ORDER — MIDAZOLAM HCL 5 MG/5ML IJ SOLN
INTRAMUSCULAR | Status: AC
  Filled 2019-03-16: qty 10

## 2019-03-16 MED ORDER — MEPERIDINE HCL 50 MG/ML IJ SOLN
INTRAMUSCULAR | Status: DC | PRN
  Administered 2019-03-16 (×2): 25 mg via INTRAVENOUS

## 2019-03-16 MED ORDER — MIDAZOLAM HCL 5 MG/5ML IJ SOLN
INTRAMUSCULAR | Status: DC | PRN
  Administered 2019-03-16 (×4): 2 mg via INTRAVENOUS

## 2019-03-16 NOTE — Discharge Instructions (Signed)
Resume usual medications and diet as before. No driving for 24 hours. Next colonoscopy in 5 hours.  PATIENT INSTRUCTIONS POST-ANESTHESIA  IMMEDIATELY FOLLOWING SURGERY:  Do not drive or operate machinery for the first twenty four hours after surgery.  Do not make any important decisions for twenty four hours after surgery or while taking narcotic pain medications or sedatives.  If you develop intractable nausea and vomiting or a severe headache please notify your doctor immediately.  FOLLOW-UP:  Please make an appointment with your surgeon as instructed. You do not need to follow up with anesthesia unless specifically instructed to do so.  WOUND CARE INSTRUCTIONS (if applicable):  Keep a dry clean dressing on the anesthesia/puncture wound site if there is drainage.  Once the wound has quit draining you may leave it open to air.  Generally you should leave the bandage intact for twenty four hours unless there is drainage.  If the epidural site drains for more than 36-48 hours please call the anesthesia department.  QUESTIONS?:  Please feel free to call your physician or the hospital operator if you have any questions, and they will be happy to assist you.       Colonoscopy, Adult, Care After This sheet gives you information about how to care for yourself after your procedure. Your doctor may also give you more specific instructions. If you have problems or questions, call your doctor. What can I expect after the procedure? After the procedure, it is common to have:  A small amount of blood in your poop for 24 hours.  Some gas.  Mild cramping or bloating in your belly. Follow these instructions at home: General instructions  For the first 24 hours after the procedure: ? Do not drive or use machinery. ? Do not sign important documents. ? Do not drink alcohol. ? Do your daily activities more slowly than normal. ? Eat foods that are soft and easy to digest.  Take over-the-counter or  prescription medicines only as told by your doctor. To help cramping and bloating:   Try walking around.  Put heat on your belly (abdomen) as told by your doctor. Use a heat source that your doctor recommends, such as a moist heat pack or a heating pad. ? Put a towel between your skin and the heat source. ? Leave the heat on for 20-30 minutes. ? Remove the heat if your skin turns bright red. This is especially important if you cannot feel pain, heat, or cold. You can get burned. Eating and drinking   Drink enough fluid to keep your pee (urine) clear or pale yellow.  Return to your normal diet as told by your doctor. Avoid heavy or fried foods that are hard to digest.  Avoid drinking alcohol for as long as told by your doctor. Contact a doctor if:  You have blood in your poop (stool) 2-3 days after the procedure. Get help right away if:  You have more than a small amount of blood in your poop.  You see large clumps of tissue (blood clots) in your poop.  Your belly is swollen.  You feel sick to your stomach (nauseous).  You throw up (vomit).  You have a fever.  You have belly pain that gets worse, and medicine does not help your pain. Summary  After the procedure, it is common to have a small amount of blood in your poop. You may also have mild cramping and bloating in your belly.  For the first 24 hours  after the procedure, do not drive or use machinery, do not sign important documents, and do not drink alcohol.  Get help right away if you have a lot of blood in your poop, feel sick to your stomach, have a fever, or have more belly pain. This information is not intended to replace advice given to you by your health care provider. Make sure you discuss any questions you have with your health care provider. Document Released: 01/16/2011 Document Revised: 10/14/2017 Document Reviewed: 09/07/2016 Elsevier Interactive Patient Education  2019 Elsevier  Inc.    Hemorrhoids Hemorrhoids are swollen veins that may develop:  In the butt (rectum). These are called internal hemorrhoids.  Around the opening of the butt (anus). These are called external hemorrhoids. Hemorrhoids can cause pain, itching, or bleeding. Most of the time, they do not cause serious problems. They usually get better with diet changes, lifestyle changes, and other home treatments. What are the causes? This condition may be caused by:  Having trouble pooping (constipation).  Pushing hard (straining) to poop.  Watery poop (diarrhea).  Pregnancy.  Being very overweight (obese).  Sitting for long periods of time.  Heavy lifting or other activity that causes you to strain.  Anal sex.  Riding a bike for a long period of time. What are the signs or symptoms? Symptoms of this condition include:  Pain.  Itching or soreness in the butt.  Bleeding from the butt.  Leaking poop.  Swelling in the area.  One or more lumps around the opening of your butt. How is this diagnosed? A doctor can often diagnose this condition by looking at the affected area. The doctor may also:  Do an exam that involves feeling the area with a gloved hand (digital rectal exam).  Examine the area inside your butt using a small tube (anoscope).  Order blood tests. This may be done if you have lost a lot of blood.  Have you get a test that involves looking inside the colon using a flexible tube with a camera on the end (sigmoidoscopy or colonoscopy). How is this treated? This condition can usually be treated at home. Your doctor may tell you to change what you eat, make lifestyle changes, or try home treatments. If these do not help, procedures can be done to remove the hemorrhoids or make them smaller. These may involve:  Placing rubber bands at the base of the hemorrhoids to cut off their blood supply.  Injecting medicine into the hemorrhoids to shrink them.  Shining a type  of light energy onto the hemorrhoids to cause them to fall off.  Doing surgery to remove the hemorrhoids or cut off their blood supply. Follow these instructions at home: Eating and drinking   Eat foods that have a lot of fiber in them. These include whole grains, beans, nuts, fruits, and vegetables.  Ask your doctor about taking products that have added fiber (fibersupplements).  Reduce the amount of fat in your diet. You can do this by: ? Eating low-fat dairy products. ? Eating less red meat. ? Avoiding processed foods.  Drink enough fluid to keep your pee (urine) pale yellow. Managing pain and swelling   Take a warm-water bath (sitz bath) for 20 minutes to ease pain. Do this 3-4 times a day. You may do this in a bathtub or using a portable sitz bath that fits over the toilet.  If told, put ice on the painful area. It may be helpful to use ice between your warm  baths. ? Put ice in a plastic bag. ? Place a towel between your skin and the bag. ? Leave the ice on for 20 minutes, 2-3 times a day. General instructions  Take over-the-counter and prescription medicines only as told by your doctor. ? Medicated creams and medicines may be used as told.  Exercise often. Ask your doctor how much and what kind of exercise is best for you.  Go to the bathroom when you have the urge to poop. Do not wait.  Avoid pushing too hard when you poop.  Keep your butt dry and clean. Use wet toilet paper or moist towelettes after pooping.  Do not sit on the toilet for a long time.  Keep all follow-up visits as told by your doctor. This is important. Contact a doctor if you:  Have pain and swelling that do not get better with treatment or medicine.  Have trouble pooping.  Cannot poop.  Have pain or swelling outside the area of the hemorrhoids. Get help right away if you have:  Bleeding that will not stop. Summary  Hemorrhoids are swollen veins in the butt or around the opening of the  butt.  They can cause pain, itching, or bleeding.  Eat foods that have a lot of fiber in them. These include whole grains, beans, nuts, fruits, and vegetables.  Take a warm-water bath (sitz bath) for 20 minutes to ease pain. Do this 3-4 times a day. This information is not intended to replace advice given to you by your health care provider. Make sure you discuss any questions you have with your health care provider. Document Released: 09/22/2008 Document Revised: 05/05/2018 Document Reviewed: 05/05/2018 Elsevier Interactive Patient Education  2019 [employer].

## 2019-03-16 NOTE — Progress Notes (Signed)
[place] Dann had a medical procedure that required sedation on 03/16/2019. Please excuse him from work on 03/17/2019.  Thank you.  Selena Lesser RN [facility]

## 2019-03-16 NOTE — H&P (Signed)
Dominic Ayala is an 53 y.o. male.   Chief Complaint: Patient is here for colonoscopy. HPI: Patient is 53 year old Caucasian male who was evaluated in December 2019 for LLQ abdominal pain.  He had abdominal pelvic CT which revealed fatty liver and thickening to proximal sigmoid colon.  Pain has resolved.  He did not experience change in his bowel habits.  He denies rectal bleeding.  He has history of colonic adenomas.  Last colonoscopy was in April 2017 with removal of 2 serrated polyps.  He is advised to come back in 4 to 5 years by Dr. Britta Mccreedy who has since left [facility]. Family history is negative for CRC.  Past medical history  Fatty liver. History of sessile serrated polyps.   History reviewed. No pertinent family history. Social History:  reports that he has never smoked. He has never used smokeless tobacco. He reports current alcohol use. He reports that he does not use drugs.  Allergies: No Known Allergies  Medications Prior to Admission  Medication Sig Dispense Refill  . ibuprofen (ADVIL,MOTRIN) 200 MG tablet Take 200-400 mg by mouth daily as needed for headache or moderate pain.    . ciprofloxacin (CIPRO) 500 MG tablet Take 1 tablet (500 mg total) by mouth 2 (two) times daily. (Patient not taking: Reported on 03/09/2019) 28 tablet 0  . metroNIDAZOLE (FLAGYL) 500 MG tablet Take 1 tablet (500 mg total) by mouth 2 (two) times daily. (Patient not taking: Reported on 03/09/2019) 20 tablet 0    No results found for this or any previous visit (from the past 48 hour(s)). No results found.  ROS  Blood pressure 123/87, pulse 72, temperature 98.8 F (37.1 C), temperature source Oral, resp. rate 15, height 5\' 9"  (1.753 m), weight 88.5 kg, SpO2 100 %. Physical Exam  Constitutional: He appears well-developed and well-nourished.  HENT:  Mouth/Throat: Oropharynx is clear and moist.  Eyes: Conjunctivae are normal. No scleral icterus.  Neck: No thyromegaly present.   Cardiovascular: Normal rate, regular rhythm and normal heart sounds.  No murmur heard. Respiratory: Effort normal and breath sounds normal.  GI: Soft. He exhibits no distension and no mass. There is no abdominal tenderness.  Musculoskeletal:        General: No edema.  Lymphadenopathy:    He has no cervical adenopathy.  Neurological: He is alert.  Skin: Skin is warm and dry.     Assessment/Plan Abnormal CT abdomen. Diagnostic colonoscopy.  Hildred Laser, MD 03/16/2019, 11:42 AM

## 2019-03-16 NOTE — Op Note (Signed)
[facility] Patient Name: Dominic Ayala Procedure Date: 03/16/2019 11:35 AM MRN: [id number] Date of Birth: 1966/10/10 Attending MD: Hildred Laser , MD CSN: [id number] Age: 53 Admit Type: Outpatient Procedure:                Colonoscopy Indications:              Abnormal CT of the GI tract Providers:                Hildred Laser, MD, Otis Peak B. Sharon Seller, RN, Randa Spike, Technician, Raphael Gibney, Technician Referring MD:             Elayne Snare. Wolfgang Phoenix, MD Medicines:                Meperidine 50 mg IV, Midazolam 8 mg IV Complications:            No immediate complications. Estimated Blood Loss:     Estimated blood loss: none. Procedure:                Pre-Anesthesia Assessment:                           - Prior to the procedure, a History and Physical                            was performed, and patient medications and                            allergies were reviewed. The patient's tolerance of                            previous anesthesia was also reviewed. The risks                            and benefits of the procedure and the sedation                            options and risks were discussed with the patient.                            All questions were answered, and informed consent                            was obtained. Prior Anticoagulants: The patient has                            taken no previous anticoagulant or antiplatelet                            agents. ASA Grade Assessment: I - A normal, healthy                            patient. After reviewing the risks and benefits,  the patient was deemed in satisfactory condition to                            undergo the procedure.                           After obtaining informed consent, the colonoscope                            was passed under direct vision. Throughout the                            procedure, the patient's blood pressure, pulse, and                          oxygen saturations were monitored continuously. The                            [id number]-[id number] ([id number]) scope was introduced through                            the anus and advanced to the the cecum, identified                            by appendiceal orifice and ileocecal valve. The                            colonoscopy was performed without difficulty. The                            patient tolerated the procedure well. The quality                            of the bowel preparation was good except the cecum                            was fair. Scope In: 11:53:27 AM Scope Out: 12:07:25 PM Scope Withdrawal Time: 0 hours 9 minutes 24 seconds  Total Procedure Duration: 0 hours 13 minutes 58 seconds  Findings:      The perianal and digital rectal examinations were normal.      The colon (entire examined portion) appeared normal.      Internal hemorrhoids were found during retroflexion. The hemorrhoids       were small. Impression:               - The entire examined colon is normal.                           - Internal hemorrhoids.                           - No specimens collected. Moderate Sedation:      Moderate (conscious) sedation was administered by the endoscopy nurse       and supervised by the endoscopist. The following parameters were       monitored: oxygen saturation,  heart rate, blood pressure, CO2       capnography and response to care. Total physician intraservice time was       20 minutes. Recommendation:           - Patient has a contact number available for                            emergencies. The signs and symptoms of potential                            delayed complications were discussed with the                            patient. Return to normal activities tomorrow.                            Written discharge instructions were provided to the                            patient.                           - Resume previous diet today.                            - Continue present medications.                           - Repeat colonoscopy in 5 years for surveillance. Procedure Code(s):        --- Professional ---                           [phone], Colonoscopy, flexible; diagnostic, including                            collection of specimen(s) by brushing or washing,                            when performed (separate procedure)                           G0500, Moderate sedation services provided by the                            same physician or other qualified health care                            professional performing a gastrointestinal                            endoscopic service that sedation supports,                            requiring the presence of an independent trained  observer to assist in the monitoring of the                            patient's level of consciousness and physiological                            status; initial 15 minutes of intra-service time;                            patient age 83 years or older (additional time may                            be reported with [phone], as appropriate) Diagnosis Code(s):        --- Professional ---                           K64.8, Other hemorrhoids                           R93.3, Abnormal findings on diagnostic imaging of                            other parts of digestive tract CPT copyright 2018 American Medical Association. All rights reserved. The codes documented in this report are preliminary and upon coder review may  be revised to meet current compliance requirements. Hildred Laser, MD Hildred Laser, MD 03/16/2019 12:16:13 PM This report has been signed electronically. Number of Addenda: 0

## 2019-03-20 ENCOUNTER — Encounter (HOSPITAL_COMMUNITY): Payer: Self-pay | Admitting: Internal Medicine

## 2019-09-30 IMAGING — US ULTRASOUND ABDOMEN LIMITED
1 series · 14 of 25 positions shown · non-contrast
Comparison: None.

CLINICAL DATA: Elevated liver function tests

EXAM:
ULTRASOUND ABDOMEN LIMITED RIGHT UPPER QUADRANT

[Series 1: ultrasound abdomen limited · 0.19mm/px · 14 of 67 slices shown]
[im 1/67]
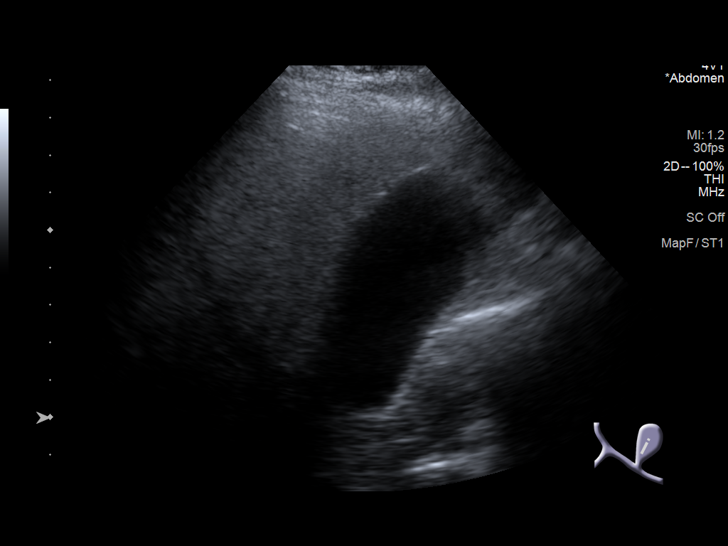
[im 6/67]
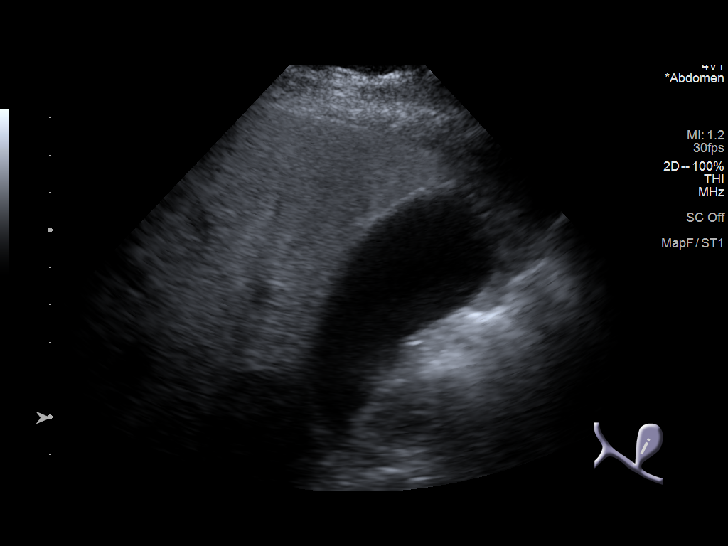
[im 12/67]
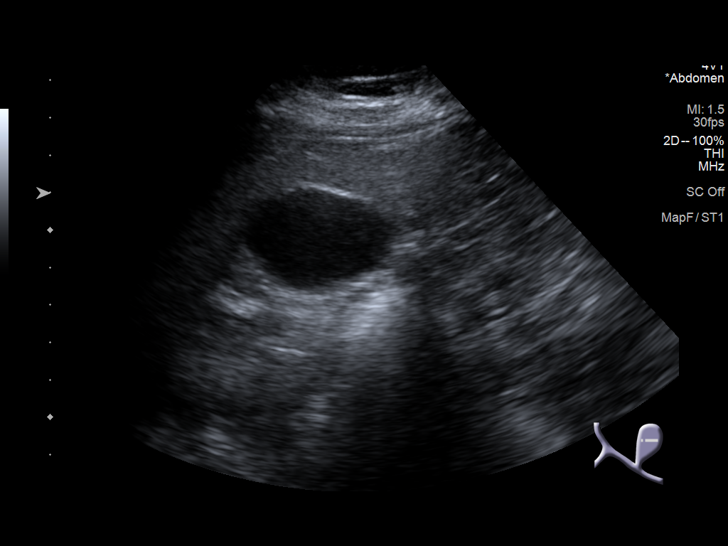
[im 17/67]
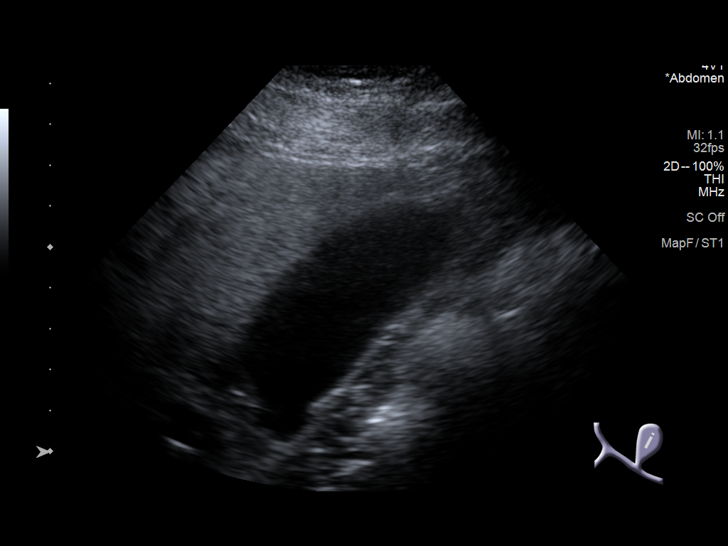
[im 23/67]
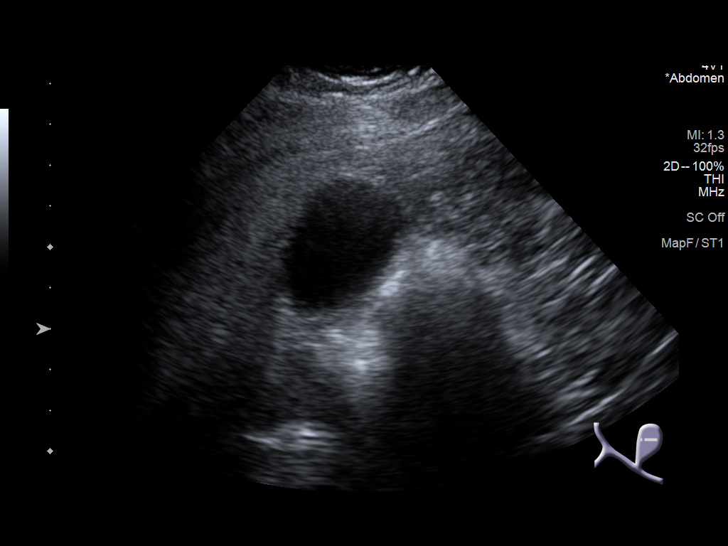
[im 25/67]
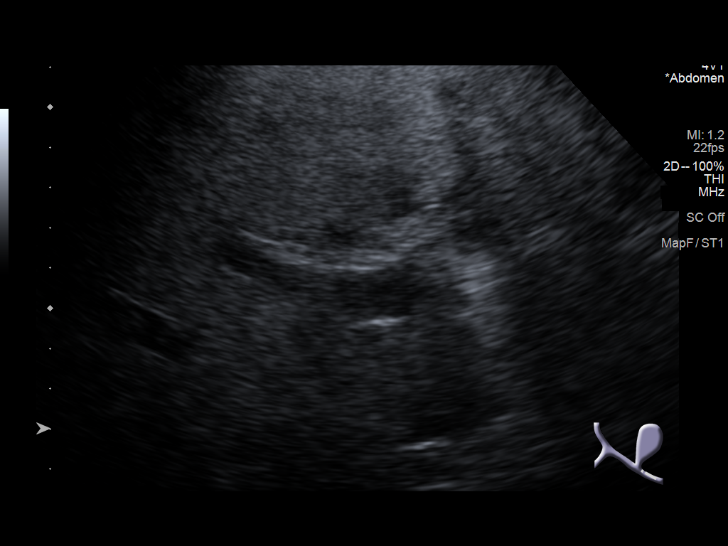
[im 31/67]
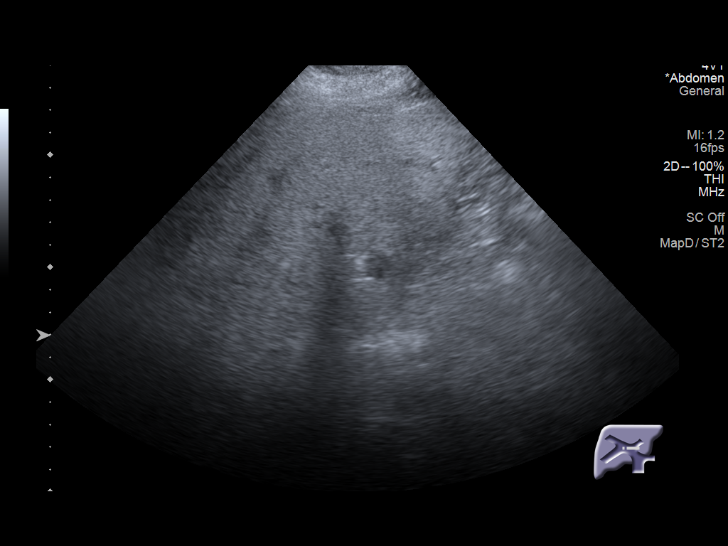
[im 36/67]
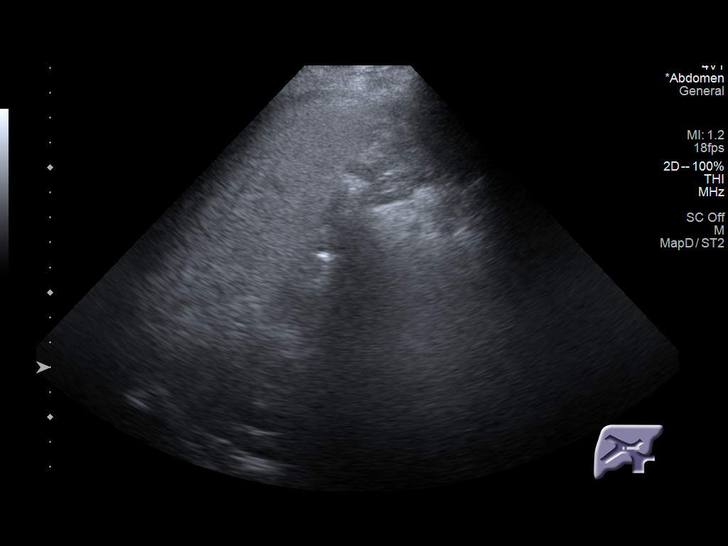
[im 42/67]
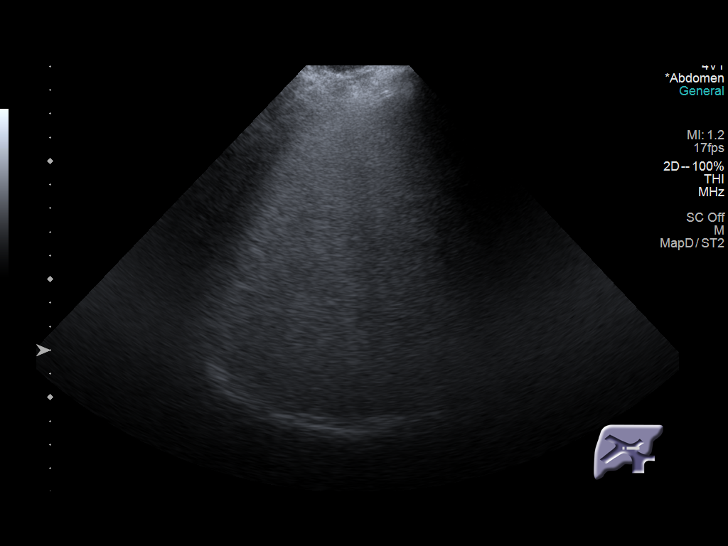
[im 45/67]
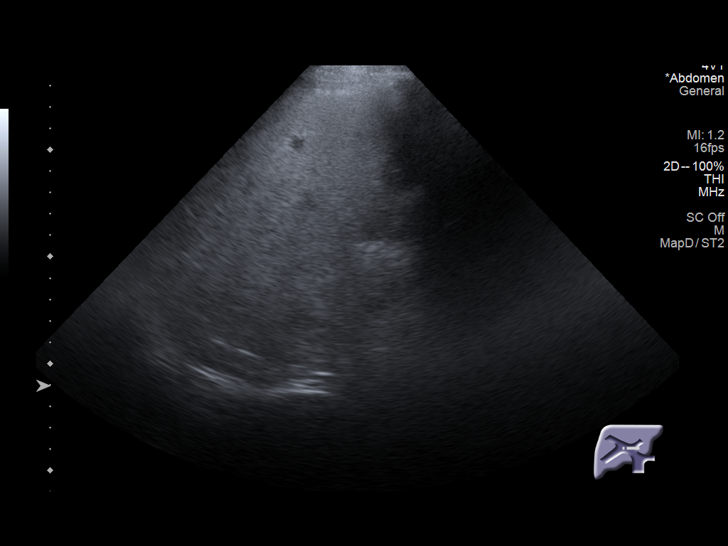
[im 50/67]
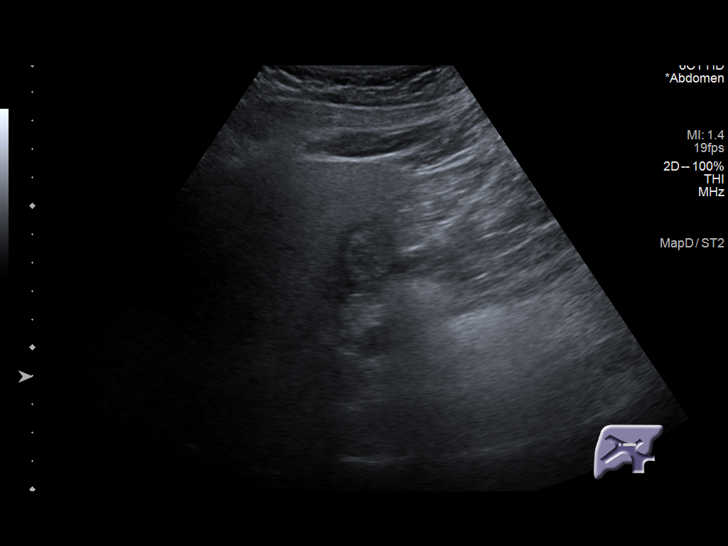
[im 56/67]
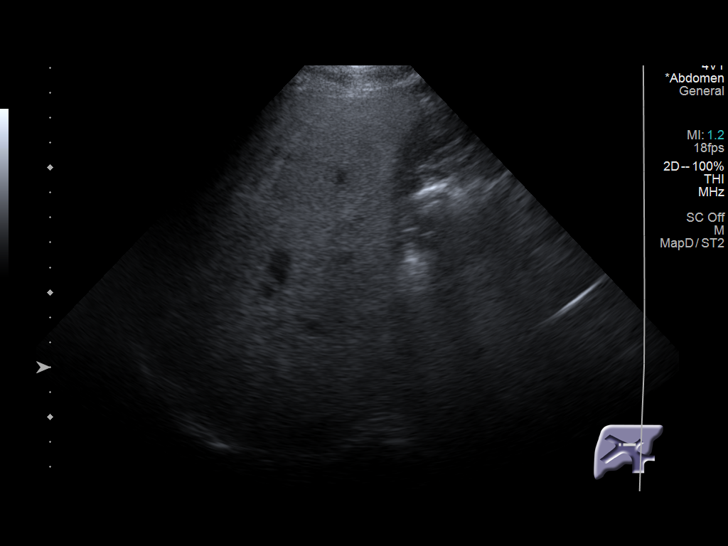
[im 61/67]
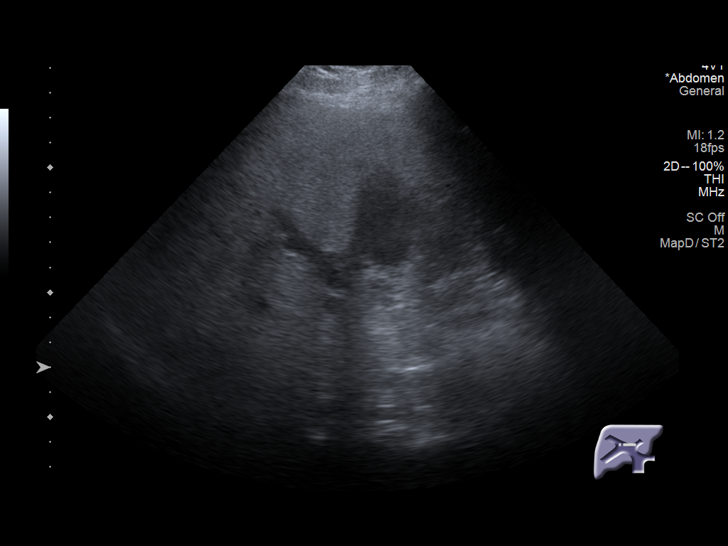
[im 67/67]
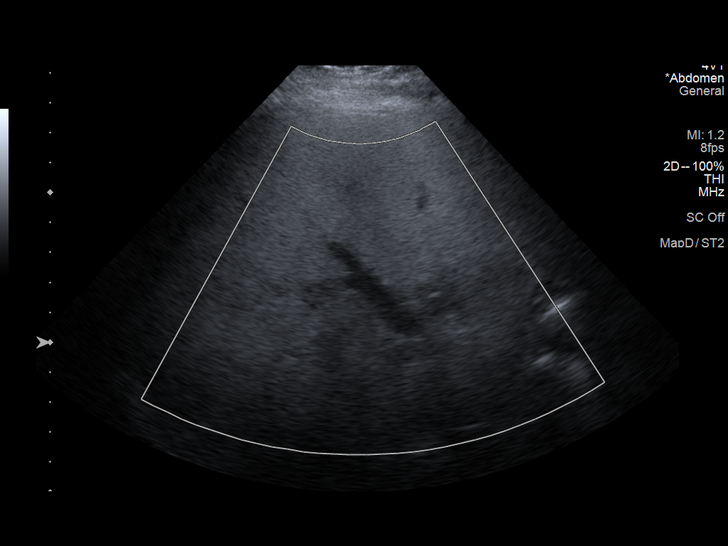

[14 of 25 positions shown; findings below may reference images not displayed]

FINDINGS: Gallbladder:

No gallstones or wall thickening visualized. No sonographic Murphy
sign noted by sonographer.

Common bile duct:

Diameter: 2.8 mm

Liver:

Diffusely increased in echogenicity without focal mass. Portal vein
is patent on color Doppler imaging with normal direction of blood
flow towards the liver.
IMPRESSION: Diffuse hepatic steatosis.

Normal gallbladder and biliary tree.

## 2024-02-11 ENCOUNTER — Encounter (INDEPENDENT_AMBULATORY_CARE_PROVIDER_SITE_OTHER): Payer: Self-pay | Admitting: *Deleted
